# Patient Record
Sex: Male | Born: 1950 | Race: White | Hispanic: No | Marital: Single | Smoking: Heavy tobacco smoker
Health system: Southern US, Community
[De-identification: ages and names within clinical notes are randomized; demographics above are authoritative.]

---

## 2017-03-20 ENCOUNTER — Encounter (HOSPITAL_COMMUNITY): Payer: Self-pay | Admitting: Emergency Medicine

## 2017-03-20 ENCOUNTER — Emergency Department (HOSPITAL_COMMUNITY): Payer: Medicare Other

## 2017-03-20 ENCOUNTER — Observation Stay (HOSPITAL_COMMUNITY)
Admission: EM | Admit: 2017-03-20 | Discharge: 2017-03-21 | Disposition: A | Discharge status: Home / Self Care | Payer: Medicare Other | Attending: Family Medicine | Admitting: Family Medicine

## 2017-03-20 DIAGNOSIS — L03116 Cellulitis of left lower limb: Secondary | ICD-10-CM | POA: Diagnosis not present

## 2017-03-20 DIAGNOSIS — L03119 Cellulitis of unspecified part of limb: Secondary | ICD-10-CM

## 2017-03-20 DIAGNOSIS — L03032 Cellulitis of left toe: Secondary | ICD-10-CM | POA: Diagnosis not present

## 2017-03-20 DIAGNOSIS — F17210 Nicotine dependence, cigarettes, uncomplicated: Secondary | ICD-10-CM | POA: Diagnosis not present

## 2017-03-20 DIAGNOSIS — R030 Elevated blood-pressure reading, without diagnosis of hypertension: Secondary | ICD-10-CM | POA: Insufficient documentation

## 2017-03-20 DIAGNOSIS — L0390 Cellulitis, unspecified: Secondary | ICD-10-CM | POA: Diagnosis present

## 2017-03-20 DIAGNOSIS — M79672 Pain in left foot: Secondary | ICD-10-CM | POA: Diagnosis present

## 2017-03-20 DIAGNOSIS — L02619 Cutaneous abscess of unspecified foot: Secondary | ICD-10-CM

## 2017-03-20 LAB — COMPREHENSIVE METABOLIC PANEL
ALT: 11 U/L — ABNORMAL LOW (ref 17–63)
AST: 15 U/L (ref 15–41)
Albumin: 3.4 g/dL — ABNORMAL LOW (ref 3.5–5.0)
Alkaline Phosphatase: 71 U/L (ref 38–126)
Anion gap: 9 (ref 5–15)
BUN: 26 mg/dL — ABNORMAL HIGH (ref 6–20)
CHLORIDE: 101 mmol/L (ref 101–111)
CO2: 25 mmol/L (ref 22–32)
Calcium: 9.1 mg/dL (ref 8.9–10.3)
Creatinine, Ser: 1.3 mg/dL — ABNORMAL HIGH (ref 0.61–1.24)
GFR, EST NON AFRICAN AMERICAN: 56 mL/min — AB (ref >60)
Glucose, Bld: 106 mg/dL — ABNORMAL HIGH (ref 65–99)
POTASSIUM: 4.2 mmol/L (ref 3.5–5.1)
SODIUM: 135 mmol/L (ref 135–145)
Total Bilirubin: 0.6 mg/dL (ref 0.3–1.2)
Total Protein: 7.1 g/dL (ref 6.5–8.1)

## 2017-03-20 LAB — CBC WITH DIFFERENTIAL/PLATELET
Basophils Absolute: 0 10*3/uL (ref 0.0–0.1)
Basophils Relative: 0 %
Eosinophils Absolute: 0.3 10*3/uL (ref 0.0–0.7)
Eosinophils Relative: 2 %
HEMATOCRIT: 40.9 % (ref 39.0–52.0)
HEMOGLOBIN: 13.7 g/dL (ref 13.0–17.0)
LYMPHS ABS: 2.3 10*3/uL (ref 0.7–4.0)
LYMPHS PCT: 14 %
MCH: 31.6 pg (ref 26.0–34.0)
MCHC: 33.5 g/dL (ref 30.0–36.0)
MCV: 94.2 fL (ref 78.0–100.0)
Monocytes Absolute: 1.4 10*3/uL — ABNORMAL HIGH (ref 0.1–1.0)
Monocytes Relative: 9 %
NEUTROS PCT: 75 %
Neutro Abs: 11.7 10*3/uL — ABNORMAL HIGH (ref 1.7–7.7)
PLATELETS: 197 10*3/uL (ref 150–400)
RBC: 4.34 MIL/uL (ref 4.22–5.81)
RDW: 13.6 % (ref 11.5–15.5)
WBC: 15.8 10*3/uL — AB (ref 4.0–10.5)

## 2017-03-20 LAB — HEMOGLOBIN A1C
Hgb A1c MFr Bld: 5.5 % (ref 4.8–5.6)
Mean Plasma Glucose: 111.15 mg/dL

## 2017-03-20 MED ORDER — SODIUM CHLORIDE 0.9 % IV SOLN
INTRAVENOUS | Status: DC
  Administered 2017-03-20: 18:00:00 via INTRAVENOUS

## 2017-03-20 MED ORDER — ENOXAPARIN SODIUM 40 MG/0.4ML ~~LOC~~ SOLN
40.0000 mg | SUBCUTANEOUS | Status: DC
  Administered 2017-03-20: 40 mg via SUBCUTANEOUS
  Filled 2017-03-20: qty 0.4

## 2017-03-20 MED ORDER — CEFAZOLIN SODIUM-DEXTROSE 1-4 GM/50ML-% IV SOLN
1.0000 g | Freq: Three times a day (TID) | INTRAVENOUS | Status: DC
  Administered 2017-03-20 – 2017-03-21 (×3): 1 g via INTRAVENOUS
  Filled 2017-03-20 (×12): qty 50

## 2017-03-20 MED ORDER — CEFAZOLIN SODIUM-DEXTROSE 1-4 GM/50ML-% IV SOLN
1.0000 g | Freq: Once | INTRAVENOUS | Status: AC
  Administered 2017-03-20: 1 g via INTRAVENOUS
  Filled 2017-03-20: qty 50

## 2017-03-20 MED ORDER — ACETAMINOPHEN 325 MG PO TABS
650.0000 mg | ORAL_TABLET | Freq: Four times a day (QID) | ORAL | Status: DC | PRN
  Administered 2017-03-20: 650 mg via ORAL
  Filled 2017-03-20: qty 2

## 2017-03-20 MED ORDER — ACETAMINOPHEN 650 MG RE SUPP: 650.0000 mg | Freq: Four times a day (QID) | RECTAL | Status: DC | PRN

## 2017-03-20 NOTE — ED Triage Notes (Signed)
Patient complains of left foot swelling and redness x 1 week. Patient states he noticed purulent discharge from left 4th toe.

## 2017-03-20 NOTE — ED Notes (Signed)
Pt eating lunch

## 2017-03-20 NOTE — H&P (Signed)
TRH H&P    Patient Demographics:    Dezmon Conover, is a 66 y.o. male  MRN: [id number]  DOB - 25-Dec-1950  Admit Date - 03/20/2017  Referring MD/NP/PA: Dr. Verdie Mosher  Outpatient Primary MD for the patient is Patient, No Pcp Per  Patient coming from: Home  Chief Complaint  Patient presents with  . Foot Pain      HPI:    Braeson Rupe  is a 66 y.o. male, With no significant medical history came to hospital with left foot swelling and redness for 1 week. Patient says that about 2 weeks ago he had a small blister popped over the left fourth toe, and had worsening redness and swelling of the foot up to the leg. He also had chills but denies fever. Cardiac he denies nausea vomiting or diarrhea. No chest pain or shortness of breath. No dysuria, urgency or frequency of urination.  In the ED, lab work revealed WBC of 15,000. Patient started on, cefazolin.     Review of systems:    In addition to the HPI above,  No Fever-chills, No Headache, No changes with Vision or hearing, No problems swallowing food or Liquids, No Chest pain, Cough or Shortness of Breath, No Abdominal pain, No Nausea or Vomiting, bowel movements are regular, No Blood in stool or Urine, No dysuria, No new skin rashes or bruises, No new joints pains-aches,  No new weakness, tingling, numbness in any extremity, No recent weight gain or loss, No polyuria, polydypsia or polyphagia, No significant Mental Stressors.  All other systems reviewed and are negative.   With Past History of the following :   No history of diabetes mellitus   Social History:      Social History  Substance Use Topics  . Smoking status: Heavy Tobacco Smoker    Packs/day: 1.50    Types: Cigarettes  . Smokeless tobacco: Never Used  . Alcohol use Yes     Comment: occ       Family History :   Patient's mother were diagnosed with gastric cancer    Home  Medications:   Prior to Admission medications   Medication Sig Start Date End Date Taking? Authorizing Provider  naproxen sodium (ANAPROX) 220 MG tablet Take 440 mg by mouth 2 (two) times daily with a meal.   Yes [provider]     Allergies:    No Known Allergies   Physical Exam:   Vitals  Blood pressure (!) 151/92, pulse 79, temperature 99.1 F (37.3 C), temperature source Oral, height 6' (1.829 m), weight 83.9 kg (185 lb), SpO2 97 %.  1.  General: Appears in no acute distress   2. Psychiatric:  Intact judgement and  insight, awake alert, oriented x 3.  3. Neurologic: No focal neurological deficits, all cranial nerves intact.Strength 5/5 all 4 extremities, sensation intact all 4 extremities, plantars down going.  4. Eyes :  anicteric sclerae, moist conjunctivae with no lid lag. PERRLA.  5. ENMT:  Oropharynx clear with moist mucous membranes and good dentition  6. Neck:  supple, no cervical lymphadenopathy appriciated, No thyromegaly  7. Respiratory : Normal respiratory effort, good air movement bilaterally,clear to  auscultation bilaterally  8. Cardiovascular : RRR, no gallops, rubs or murmurs, no leg edema  9. Gastrointestinal:  Positive bowel sounds, abdomen soft, non-tender to palpation,no hepatosplenomegaly, no rigidity or guarding       10. Skin:  Left foot erythematous, edematous , left fourth toe has severe edema, mild tenderness to palpation   11.Musculoskeletal:  Good muscle tone,  joints appear normal , no effusions,  normal range of motion    Data Review:    CBC  Recent Labs Lab 03/20/17 0948  WBC 15.8*  HGB 13.7  HCT 40.9  PLT 197  MCV 94.2  MCH 31.6  MCHC 33.5  RDW 13.6  LYMPHSABS 2.3  MONOABS 1.4*  EOSABS 0.3  BASOSABS 0.0   ------------------------------------------------------------------------------------------------------------------  Chemistries   Recent Labs Lab 03/20/17 0948  NA 135  K 4.2  CL 101  CO2  25  GLUCOSE 106*  BUN 26*  CREATININE 1.30*  CALCIUM 9.1  AST 15  ALT 11*  ALKPHOS 71  BILITOT 0.6   ------------------------------------------------------------------------------------------------------------------  ------------------------------------------------------------------------------------------------------------------ GFR: Estimated Creatinine Clearance: 61.4 mL/min (A) (by C-G formula based on SCr of 1.3 mg/dL (H)). Liver Function Tests:  Recent Labs Lab 03/20/17 0948  AST 15  ALT 11*  ALKPHOS 71  BILITOT 0.6  PROT 7.1  ALBUMIN 3.4*    --------------------------------------------------------------------------------------------------------------- Urine analysis: No results found for: COLORURINE, APPEARANCEUR, LABSPEC, PHURINE, GLUCOSEU, HGBUR, BILIRUBINUR, KETONESUR, PROTEINUR, UROBILINOGEN, NITRITE, LEUKOCYTESUR    Imaging Results:    Dg Foot Complete Left  Result Date: 03/20/2017 CLINICAL DATA:  Redness and swelling left foot mostly in 4th toe x couple of days/no known injury EXAM: LEFT FOOT - COMPLETE 3+ VIEW COMPARISON:  None. FINDINGS: There is no evidence of fracture or dislocation. There is no evidence of arthropathy or other focal bone abnormality. Soft tissues are unremarkable. Incidental note made of chronic spurring at the dorsal and plantar margins of the posterior calcaneus. IMPRESSION: No acute findings. Electronically Signed   By: Bary Richard M.D.   On: 03/20/2017 09:49      Assessment & Plan:    Active Problems:   Cellulitis   1. Cellulitis-left foot cellulitis, patient started on cefazolin in the ED. Will continue with cefazolin per pharmacy consultation.\ 2. Elevated blood pressure-no history of hypertension, will monitor the hospital. If BP continues to be elevated. We'll start antihypertensive medication 3. Will check hemoglobin A1c to rule out underlying diabetes/prediabetes.    DVT Prophylaxis-   Lovenox   AM Labs Ordered,  also please review Full Orders  Family Communication: Admission, patients condition and plan of care including tests being ordered have been discussed with the patient * who indicate understanding and agree with the plan and Code Status.  Code Status:   DO NOT RESUSCITATE  Admission status:  Observation    Time spent in minutes :  60 minutes   LAMA,GAGAN S M.D on 03/20/2017 at 12:34 PM  Between 7am to 7pm - Pager - [phone]. After 7pm go to [email or web address] - password [facility]  [facility] - Office  [phone]

## 2017-03-20 NOTE — ED Provider Notes (Signed)
[facility]-EMERGENCY DEPT Provider Note   CSN: [id number] Arrival date & time: 03/20/17  7829     History   Chief Complaint Chief Complaint  Patient presents with  . Foot Pain    HPI Daniel Roberson is a 66 y.o. male.  Patient is a 66 year old male who presents to the emergency department with a complaint of left foot pain.   Patient states that approximately 1-1-1/2 weeks ago he noted a blister on his fourth toe of the left foot. It began to drain, but the drainage was clear. Shortly after that the toe began to swell. And after that the foot and ankle began to swell. He now has pain when he attempts to walk. He states that if he is not on his leg his leg does not really hurt very much. He has not had fever, but states he may have had some chills one or 2 days during the week. He denies a diagnosis of any peripheral vascular disease or diabetes. He's not had any other injury. It is also of note that the patient states he had a lot of itching involving the left lower extremity, and he says there have been times when he has scratched the skin off of his leg because of the itching. He presents now for evaluation of the pain redness and increased warmth of his left lower extremity.      History reviewed. No pertinent past medical history.  There are no active problems to display for this patient.   History reviewed. No pertinent surgical history.     Home Medications    Prior to Admission medications   Medication Sig Start Date End Date Taking? Authorizing Provider  naproxen sodium (ANAPROX) 220 MG tablet Take 440 mg by mouth 2 (two) times daily with a meal.   Yes [provider]    Family History No family history on file.  Social History Social History  Substance Use Topics  . Smoking status: Heavy Tobacco Smoker    Packs/day: 1.50    Types: Cigarettes  . Smokeless tobacco: Never Used  . Alcohol use Yes     Comment: occ     Allergies   Patient has no known  allergies.   Review of Systems Review of Systems  Constitutional: Positive for chills. Negative for activity change.       All ROS Neg except as noted in HPI  HENT: Negative for nosebleeds.   Eyes: Negative for photophobia and discharge.  Respiratory: Negative for cough, shortness of breath and wheezing.   Cardiovascular: Negative for chest pain and palpitations.  Gastrointestinal: Negative for abdominal pain and blood in stool.  Genitourinary: Negative for dysuria, frequency and hematuria.  Musculoskeletal: Positive for arthralgias. Negative for back pain and neck pain.       Foot pain  Skin: Negative.   Neurological: Negative for dizziness, seizures and speech difficulty.  Psychiatric/Behavioral: Negative for confusion and hallucinations.     Physical Exam Updated Vital Signs BP (!) 141/75   Pulse 79   Temp 99.1 F (37.3 C) (Oral)   Ht 6' (1.829 m)   Wt 83.9 kg (185 lb)   SpO2 97%   BMI 25.09 kg/m   Physical Exam  Constitutional: He is oriented to person, place, and time. He appears well-developed and well-nourished.  Non-toxic appearance.  HENT:  Head: Normocephalic.  Right Ear: Tympanic membrane and external ear normal.  Left Ear: Tympanic membrane and external ear normal.  Eyes: Pupils are equal, round, and reactive  to light. EOM and lids are normal.  Neck: Normal range of motion. Neck supple. Carotid bruit is not present.  Cardiovascular: Normal rate, regular rhythm, normal heart sounds, intact distal pulses and normal pulses.   Pulmonary/Chest: Breath sounds normal. No respiratory distress.  Abdominal: Soft. Bowel sounds are normal. There is no tenderness. There is no guarding.  Musculoskeletal: Normal range of motion.  There is full range of motion of the left hip. There no palpable inguinal nodes on the left. There is full range of motion of the left knee, with some crepitus present. There is increased redness from the mid tibial area to the toes. There is 2+  pitting edema of the left lower extremity. The left ankle and foot are warm to touch. There is swelling of the left fourth toe. There is a resolved blister at the tip of the fourth toe, and a small open area in the fall of the plantar surface of the fifth toe. No lesions between the toes. No evidence of puncture wound of the plantar surface. The Achilles tendon is intact and not hot.  Lymphadenopathy:       Head (right side): No submandibular adenopathy present.       Head (left side): No submandibular adenopathy present.    He has no cervical adenopathy.  Neurological: He is alert and oriented to person, place, and time. He has normal strength. No cranial nerve deficit or sensory deficit.  Skin: Skin is warm and dry.  Psychiatric: He has a normal mood and affect. His speech is normal.  Nursing note and vitals reviewed.    ED Treatments / Results  Labs (all labs ordered are listed, but only abnormal results are displayed) Labs Reviewed  CBC WITH DIFFERENTIAL/PLATELET - Abnormal; Notable for the following:       Result Value   WBC 15.8 (*)    Neutro Abs 11.7 (*)    Monocytes Absolute 1.4 (*)    All other components within normal limits  CULTURE, BLOOD (ROUTINE X 2)  CULTURE, BLOOD (ROUTINE X 2)  COMPREHENSIVE METABOLIC PANEL    EKG  EKG Interpretation None       Radiology Dg Foot Complete Left  Result Date: 03/20/2017 CLINICAL DATA:  Redness and swelling left foot mostly in 4th toe x couple of days/no known injury EXAM: LEFT FOOT - COMPLETE 3+ VIEW COMPARISON:  None. FINDINGS: There is no evidence of fracture or dislocation. There is no evidence of arthropathy or other focal bone abnormality. Soft tissues are unremarkable. Incidental note made of chronic spurring at the dorsal and plantar margins of the posterior calcaneus. IMPRESSION: No acute findings. Electronically Signed   By: Bary Richard M.D.   On: 03/20/2017 09:49    Procedures Procedures (including critical care  time)  Medications Ordered in ED Medications - No data to display   Initial Impression / Assessment and Plan / ED Course  I have reviewed the triage vital signs and the nursing notes.  Pertinent labs & imaging results that were available during my care of the patient were reviewed by me and considered in my medical decision making (see chart for details).       Final Clinical Impressions(s) / ED Diagnoses MDM Temperature is 99.1. Vital signs are otherwise within normal limits. Patient is in no distress at this time.  The examination favors acute cellulitis of the left lower extremity. The comprehensive metabolic panel shows the glucose to be 106, the BUN is elevated at 26, and the  creatinine is elevated at 1.30. The competence of metabolic panel is otherwise within normal limits. The complete blood count shows the white blood cells to be elevated at 15,800. X-ray of the left foot is negative for fracture, dislocation, foreign body, or gas.  Blood cultures have been obtained. Ancef IV ordered for cellulitis.   Patient's care will be continued by Dr.Liu.   Final diagnoses:  Cellulitis and abscess of foot    New Prescriptions New Prescriptions   No medications on file     Ivery Quale, Cordelia Poche 03/21/17 1505    Lavera Guise, MD 03/22/17 [phone]

## 2017-03-20 NOTE — Progress Notes (Signed)
Pharmacy Antibiotic Note  Daniel Roberson is a 66 y.o. male admitted on 03/20/2017 with cellulitis.  Pharmacy has been consulted for Cefazolin dosing.  Plan: Cefazolin 1gm IV q8h F/U cxs and clinical progress Monitor V/S, labs  Height: 6' (182.9 cm) Weight: 185 lb (83.9 kg) IBW/kg (Calculated) : 77.6  Temp (24hrs), Avg:99.1 F (37.3 C), Min:99.1 F (37.3 C), Max:99.1 F (37.3 C)   Recent Labs Lab 03/20/17 0948  WBC 15.8*  CREATININE 1.30*    Estimated Creatinine Clearance: 61.4 mL/min (A) (by C-G formula based on SCr of 1.3 mg/dL (H)).    No Known Allergies  Antimicrobials this admission: Cefazolin 10/11 >>   Dose adjustments this admission: N/A  Microbiology results: 10/11 BCx: pending  Thank you for allowing pharmacy to be a part of this patient's care.  Elder Cyphers, BS Pharm D, [place] Clinical Pharmacist Pager [phone]  03/20/2017 1:46 PM

## 2017-03-20 NOTE — ED Provider Notes (Signed)
Medical screening examination/treatment/procedure(s) were conducted as a shared visit with non-physician practitioner(s) and myself.  I personally evaluated the patient during the encounter.   EKG Interpretation None      66 year old male who presents with left foot swelling and redness for one week. About 2 weeks ago he had a small blister that popped over the left fourth toe. Over the past week she has had increased redness and swelling of the foot up the leg with objective fevers and chills.  With leukocytosis of 15.8. Low-grade fever here but otherwise hemodynamically stable. Soft tissue swelling, erythema, warmth and tenderness of the left foot extending up to the lower left shin. Received Ancef. I discussed with Dr. Vickey Huger, who will admit for observation.   Lavera Guise, MD 03/20/17 [phone]

## 2017-03-20 NOTE — ED Notes (Signed)
Hospitalist at the bedside 

## 2017-03-20 NOTE — ED Notes (Signed)
Ordered food tray for pt.

## 2017-03-21 DIAGNOSIS — L03032 Cellulitis of left toe: Secondary | ICD-10-CM | POA: Diagnosis not present

## 2017-03-21 LAB — COMPREHENSIVE METABOLIC PANEL
ALT: 9 U/L — AB (ref 17–63)
AST: 12 U/L — ABNORMAL LOW (ref 15–41)
Albumin: 3 g/dL — ABNORMAL LOW (ref 3.5–5.0)
Alkaline Phosphatase: 59 U/L (ref 38–126)
Anion gap: 9 (ref 5–15)
BUN: 27 mg/dL — AB (ref 6–20)
CHLORIDE: 105 mmol/L (ref 101–111)
CO2: 22 mmol/L (ref 22–32)
Calcium: 8.6 mg/dL — ABNORMAL LOW (ref 8.9–10.3)
Creatinine, Ser: 1.25 mg/dL — ABNORMAL HIGH (ref 0.61–1.24)
GFR calc Af Amer: >60 mL/min (ref >60)
GFR calc non Af Amer: 58 mL/min — ABNORMAL LOW (ref >60)
GLUCOSE: 102 mg/dL — AB (ref 65–99)
POTASSIUM: 4.2 mmol/L (ref 3.5–5.1)
Sodium: 136 mmol/L (ref 135–145)
Total Bilirubin: 0.6 mg/dL (ref 0.3–1.2)
Total Protein: 6.3 g/dL — ABNORMAL LOW (ref 6.5–8.1)

## 2017-03-21 LAB — CBC
HCT: 39.6 % (ref 39.0–52.0)
Hemoglobin: 12.8 g/dL — ABNORMAL LOW (ref 13.0–17.0)
MCH: 30.5 pg (ref 26.0–34.0)
MCHC: 32.3 g/dL (ref 30.0–36.0)
MCV: 94.3 fL (ref 78.0–100.0)
PLATELETS: 203 10*3/uL (ref 150–400)
RBC: 4.2 MIL/uL — AB (ref 4.22–5.81)
RDW: 13.5 % (ref 11.5–15.5)
WBC: 12.5 10*3/uL — ABNORMAL HIGH (ref 4.0–10.5)

## 2017-03-21 MED ORDER — CEPHALEXIN 500 MG PO CAPS: 500.0000 mg | ORAL_CAPSULE | Freq: Four times a day (QID) | ORAL | 0 refills | Status: AC

## 2017-03-21 MED ORDER — AMLODIPINE BESYLATE 5 MG PO TABS: 5.0000 mg | ORAL_TABLET | Freq: Every day | ORAL | 11 refills | Status: AC

## 2017-03-21 MED ORDER — ACETAMINOPHEN 325 MG PO TABS: 650.0000 mg | ORAL_TABLET | Freq: Four times a day (QID) | ORAL | 0 refills | Status: AC | PRN

## 2017-03-21 NOTE — Care Management Obs Status (Signed)
MEDICARE OBSERVATION STATUS NOTIFICATION   Patient Details  Name: Daniel Roberson MRN: [id number] Date of Birth: 18-Nov-1950   Medicare Observation Status Notification Given:  Yes    Malcolm Metro, RN 03/21/2017, 8:08 AM

## 2017-03-21 NOTE — Discharge Summary (Signed)
Physician Discharge Summary  Daniel Roberson YQM:[id number] DOB: January 29, 1951 DOA: 03/20/2017  PCP: Patient, No Pcp Per  Admit date: 03/20/2017 Discharge date: 03/21/2017  Time spent: 25 minutes  Recommendations for Outpatient Follow-up:  1. Follow up PCP in 2 weeks   Discharge Diagnoses:  Active Problems:   Cellulitis   Discharge Condition: Stable  Diet recommendation: Regular diet  Filed Weights   03/20/17 0858 03/20/17 1350  Weight: 83.9 kg (185 lb) 80.7 kg (178 lb)    History of present illness:  66 y.o. male, With no significant medical history came to hospital with left foot swelling and redness for 1 week. Patient says that about 2 weeks ago he had a small blister popped over the left fourth toe, and had worsening redness and swelling of the foot up to the leg. He also had chills but denies fever. Cardiac he denies nausea vomiting or diarrhea.  Hospital Course:   Cellulitis- improved with Cefazolin, will send home on Keflex 500 mg po QID x 6 days.  Hypertension- BP is elevated, will start Amlodipine 5 mg po daily.  ? CKD stage 2- will stop naproxen, start Tylenol prn for pain. Will need nephrology consult as outpatient  Procedures:  None   Consultations:  None   Discharge Exam: Vitals:   03/20/17 2107 03/21/17 0511  BP: (!) 148/75 (!) 142/73  Pulse: 75 84  Resp: 18 18  Temp: 98.9 F (37.2 C) 98.1 F (36.7 C)  SpO2: 96% 95%    General: Appears in no acute distress Cardiovascular: S1S2, RRR Respiratory: Clear bilaterally Ext- Left foot erythema has improved  Discharge Instructions   Discharge Instructions    Diet - low sodium heart healthy    Complete by:  As directed    Increase activity slowly    Complete by:  As directed      Current Discharge Medication List    START taking these medications   Details  acetaminophen (TYLENOL) 325 MG tablet Take 2 tablets (650 mg total) by mouth every 6 (six) hours as needed for mild pain (or Fever >/=  101). Qty: 30 tablet, Refills: 0  Amlodipine 5 mg po daily   cephALEXin (KEFLEX) 500 MG capsule Take 1 capsule (500 mg total) by mouth 4 (four) times daily. Qty: 24 capsule, Refills: 0      STOP taking these medications     naproxen sodium (ANAPROX) 220 MG tablet        No Known Allergies Follow-up Information    [facility] EMERGENCY DEPARTMENT.   Specialty:  Emergency Medicine Why:  If symptoms worsen Contact information: [address] [id number] [place] [address] [phone]           The results of significant diagnostics from this hospitalization (including imaging, microbiology, ancillary and laboratory) are listed below for reference.    Significant Diagnostic Studies: Dg Foot Complete Left  Result Date: 03/20/2017 CLINICAL DATA:  Redness and swelling left foot mostly in 4th toe x couple of days/no known injury EXAM: LEFT FOOT - COMPLETE 3+ VIEW COMPARISON:  None. FINDINGS: There is no evidence of fracture or dislocation. There is no evidence of arthropathy or other focal bone abnormality. Soft tissues are unremarkable. Incidental note made of chronic spurring at the dorsal and plantar margins of the posterior calcaneus. IMPRESSION: No acute findings. Electronically Signed   By: Bary Richard M.D.   On: 03/20/2017 09:49    Microbiology: Recent Results (from the past 240 hour(s))  Culture, blood (routine  x 2)     Status: None (Preliminary result)   Collection Time: 03/20/17  9:48 AM  Result Value Ref Range Status   Specimen Description BLOOD LEFT ARM  Final   Special Requests   Final    BOTTLES DRAWN AEROBIC AND ANAEROBIC Blood Culture adequate volume   Culture NO GROWTH < 24 HOURS  Final   Report Status PENDING  Incomplete  Culture, blood (routine x 2)     Status: None (Preliminary result)   Collection Time: 03/20/17  9:48 AM  Result Value Ref Range Status   Specimen Description BLOOD LEFT HAND  Final   Special Requests   Final     BOTTLES DRAWN AEROBIC ONLY Blood Culture adequate volume   Culture NO GROWTH < 24 HOURS  Final   Report Status PENDING  Incomplete     Labs: Basic Metabolic Panel:  Recent Labs Lab 03/20/17 0948 03/21/17 0609  NA 135 136  K 4.2 4.2  CL 101 105  CO2 25 22  GLUCOSE 106* 102*  BUN 26* 27*  CREATININE 1.30* 1.25*  CALCIUM 9.1 8.6*   Liver Function Tests:  Recent Labs Lab 03/20/17 0948 03/21/17 0609  AST 15 12*  ALT 11* 9*  ALKPHOS 71 59  BILITOT 0.6 0.6  PROT 7.1 6.3*  ALBUMIN 3.4* 3.0*   No results for input(s): LIPASE, AMYLASE in the last 168 hours. No results for input(s): AMMONIA in the last 168 hours. CBC:  Recent Labs Lab 03/20/17 0948 03/21/17 0609  WBC 15.8* 12.5*  NEUTROABS 11.7*  --   HGB 13.7 12.8*  HCT 40.9 39.6  MCV 94.2 94.3  PLT 197 203       Signed:  Dlisa Barnwell S MD.  [facility] 03/21/2017, 10:57 AM

## 2017-03-25 LAB — CULTURE, BLOOD (ROUTINE X 2)
CULTURE: NO GROWTH
CULTURE: NO GROWTH
SPECIAL REQUESTS: ADEQUATE
Special Requests: ADEQUATE

## 2018-06-08 IMAGING — DX DG FOOT COMPLETE 3+V*L*
3 series · 3 of 3 positions shown · non-contrast
Comparison: None.

CLINICAL DATA: Redness and swelling left foot mostly in 4th toe x
couple of days/no known injury

EXAM:
LEFT FOOT - COMPLETE 3+ VIEW

[foot ap]
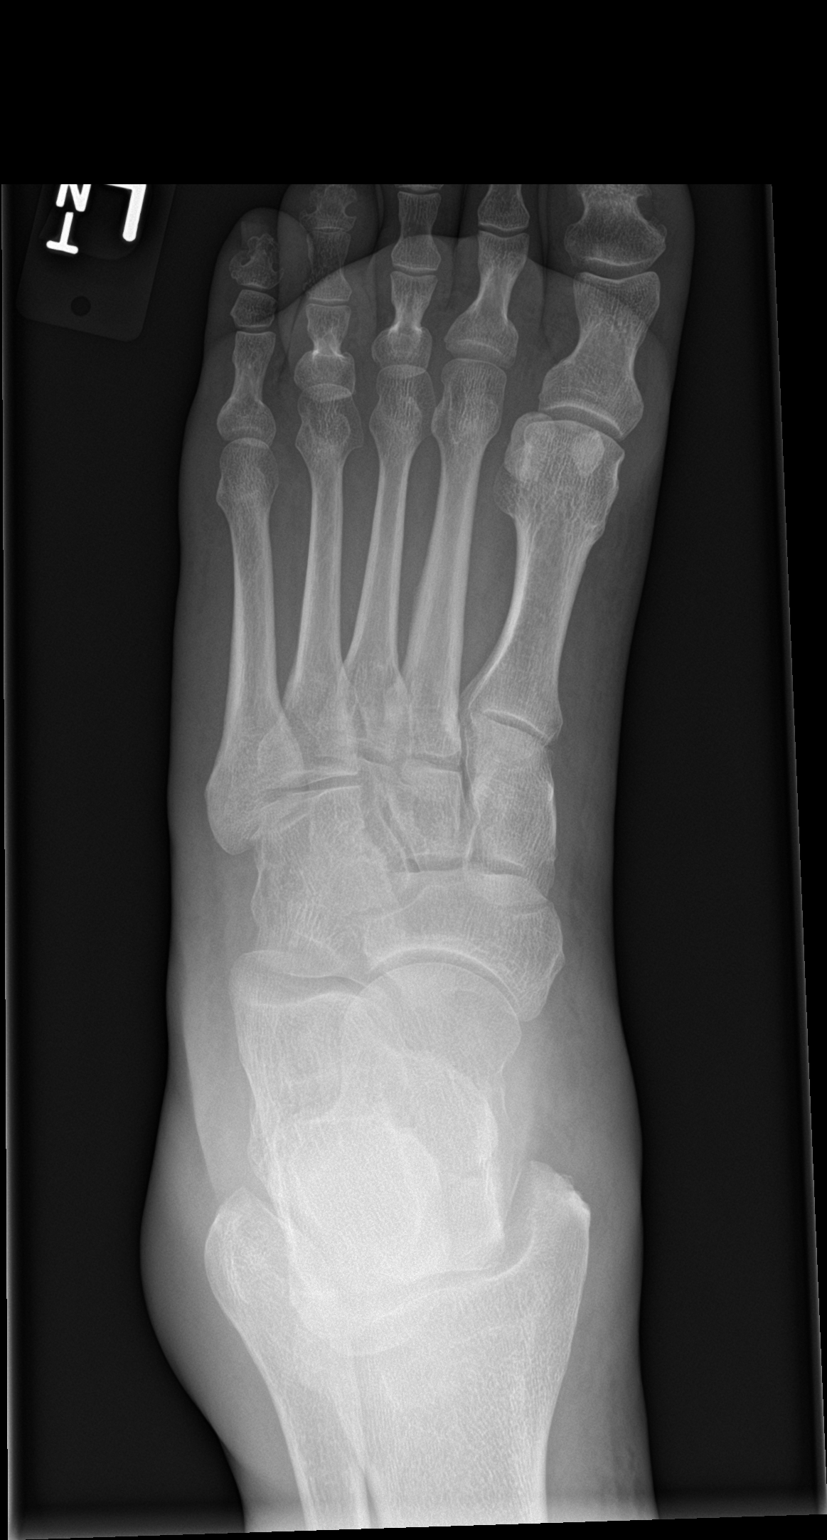

[foot obl]
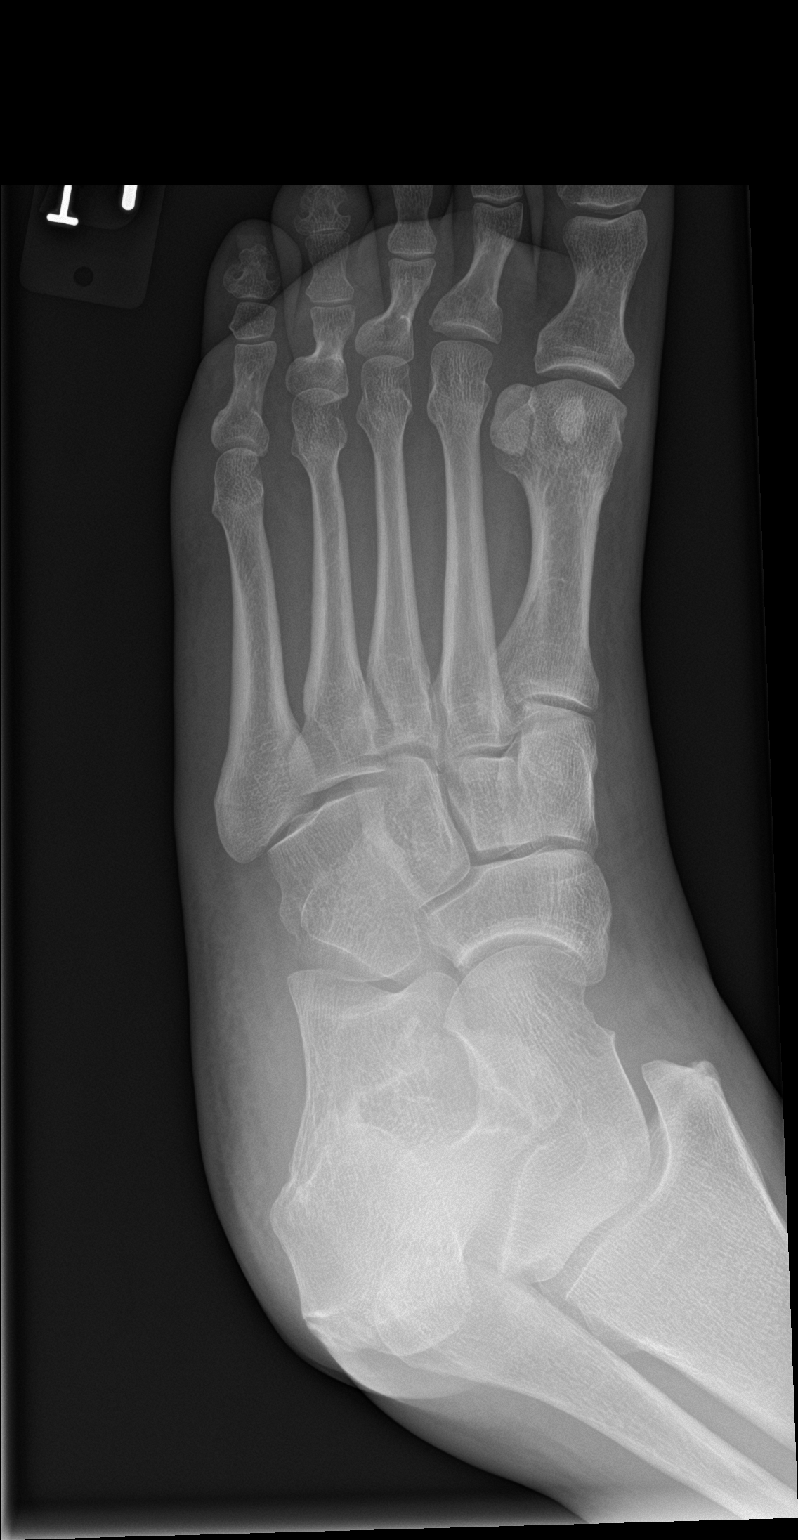

[foot lat]
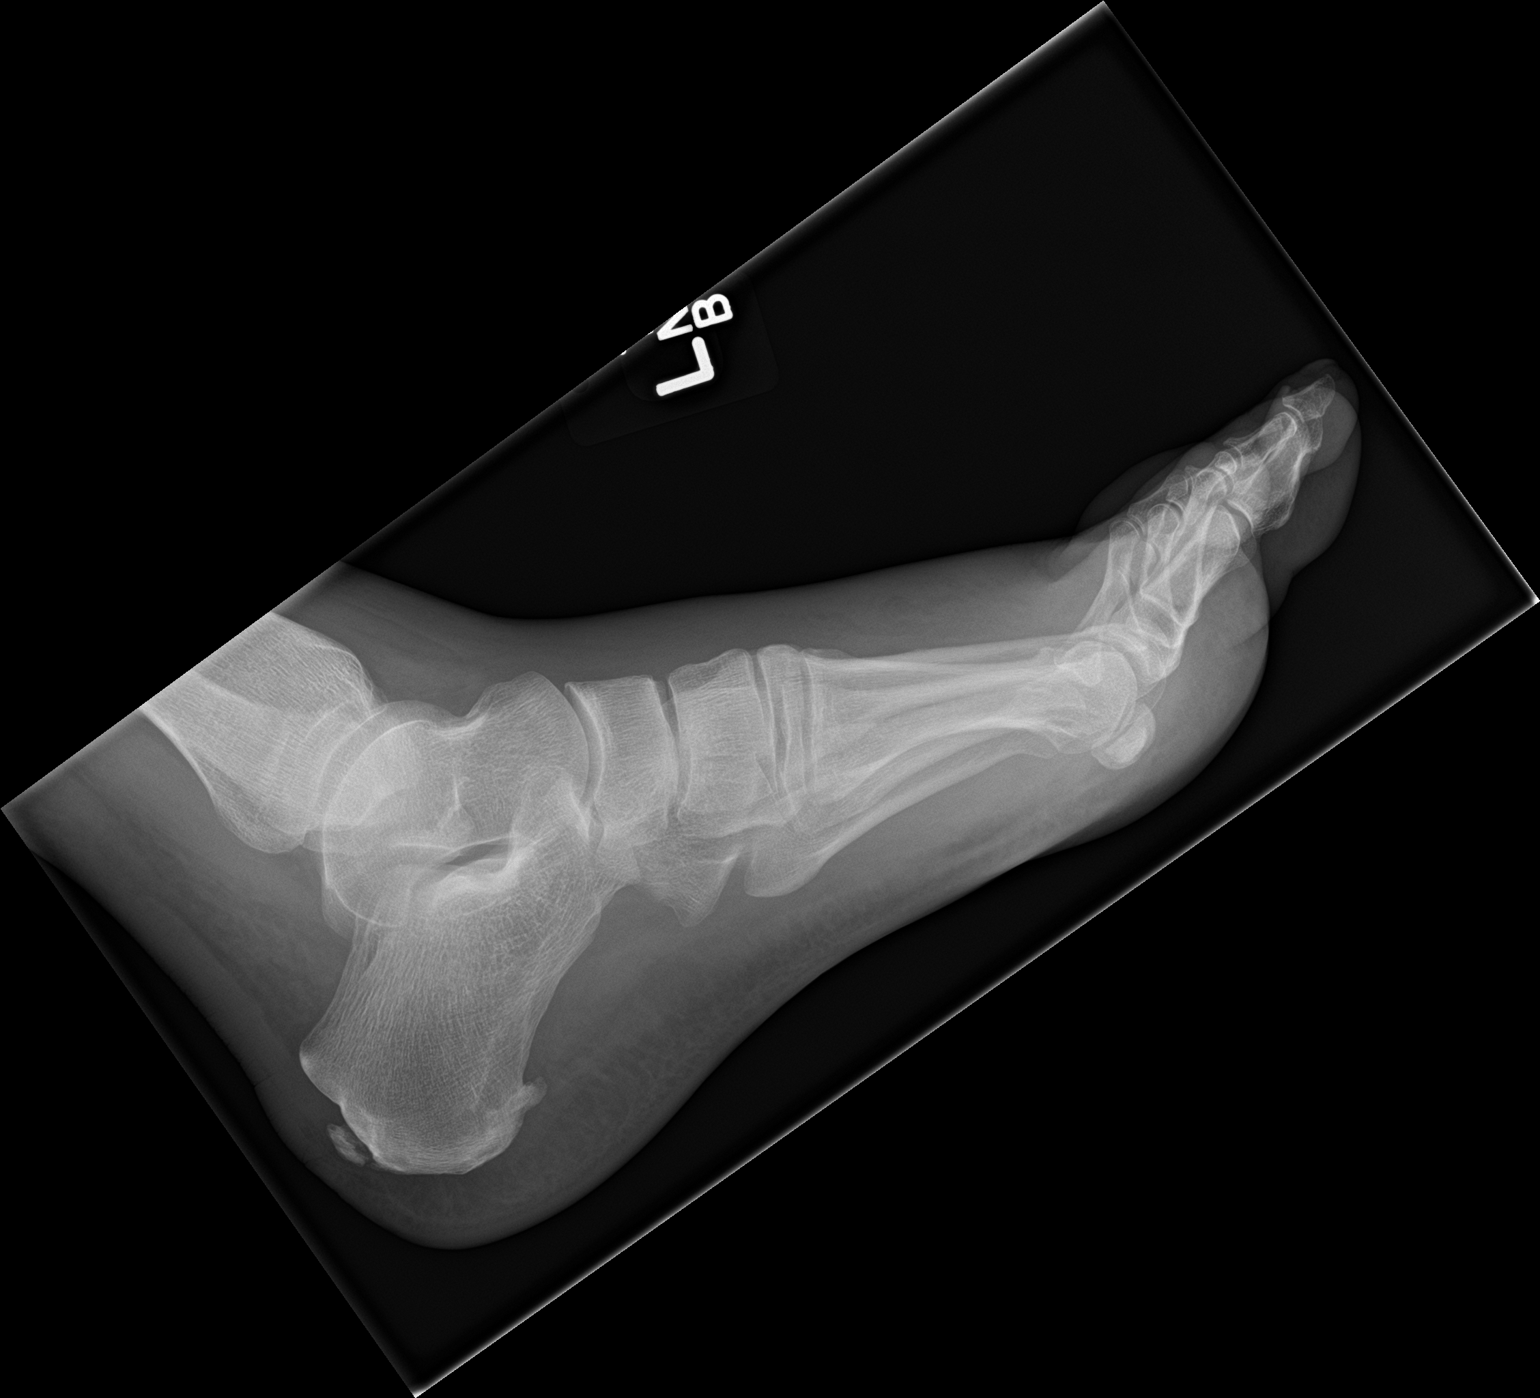

[3 of 3 positions shown; findings below may reference images not displayed]

FINDINGS: There is no evidence of fracture or dislocation. There is no
evidence of arthropathy or other focal bone abnormality. Soft
tissues are unremarkable. Incidental note made of chronic spurring
at the dorsal and plantar margins of the posterior calcaneus.
IMPRESSION: No acute findings.
